# Patient Record
Sex: Female | Born: 1995 | Race: Black or African American | Hispanic: No | Marital: Married | Smoking: Current every day smoker
Health system: Southern US, Community
[De-identification: ages and names within clinical notes are randomized; demographics above are authoritative.]

---
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
## 2018-06-27 ENCOUNTER — Emergency Department (HOSPITAL_BASED_OUTPATIENT_CLINIC_OR_DEPARTMENT_OTHER): Payer: Medicaid Other

## 2018-06-27 ENCOUNTER — Encounter (HOSPITAL_BASED_OUTPATIENT_CLINIC_OR_DEPARTMENT_OTHER): Payer: Self-pay | Admitting: *Deleted

## 2018-06-27 ENCOUNTER — Emergency Department (HOSPITAL_BASED_OUTPATIENT_CLINIC_OR_DEPARTMENT_OTHER)
Admission: EM | Admit: 2018-06-27 | Discharge: 2018-06-27 | Disposition: A | Discharge status: Home / Self Care | Payer: Medicaid Other | Attending: Emergency Medicine | Admitting: Emergency Medicine

## 2018-06-27 ENCOUNTER — Other Ambulatory Visit: Payer: Self-pay

## 2018-06-27 DIAGNOSIS — R05 Cough: Secondary | ICD-10-CM | POA: Diagnosis present

## 2018-06-27 DIAGNOSIS — J069 Acute upper respiratory infection, unspecified: Secondary | ICD-10-CM | POA: Diagnosis not present

## 2018-06-27 DIAGNOSIS — F17200 Nicotine dependence, unspecified, uncomplicated: Secondary | ICD-10-CM | POA: Diagnosis not present

## 2018-06-27 DIAGNOSIS — B9789 Other viral agents as the cause of diseases classified elsewhere: Secondary | ICD-10-CM | POA: Insufficient documentation

## 2018-06-27 NOTE — ED Notes (Signed)
Pt taken to xray 

## 2018-06-27 NOTE — ED Notes (Signed)
ED Provider at bedside. 

## 2018-06-27 NOTE — ED Triage Notes (Signed)
Cough x 4 days. No fever.

## 2018-06-27 NOTE — ED Provider Notes (Signed)
[facility] EMERGENCY DEPARTMENT Provider Note   CSN: [id number] Arrival date & time: 06/27/18  2000    History   Chief Complaint Chief Complaint  Patient presents with  . Cough    HPI Dana Stone is a 23 y.o. female.     Patient is a 23 year old female who presents with coughing.  She has a 3 to 4-day history of coughing which is mostly nonproductive but at times she coughs up some mucus.  She denies any fevers.  No shortness of breath.  No vomiting or diarrhea.  No significant URI symptoms.  No leg pain or swelling.     History reviewed. No pertinent past medical history.  There are no active problems to display for this patient.   History reviewed. No pertinent surgical history.   OB History   No obstetric history on file.      Home Medications    Prior to Admission medications   Not on File    Family History No family history on file.  Social History Social History   Tobacco Use  . Smoking status: Current Every Day Smoker  . Smokeless tobacco: Never Used  Substance Use Topics  . Alcohol use: Yes  . Drug use: Never     Allergies   Patient has no known allergies.   Review of Systems Review of Systems  Constitutional: Negative for chills, diaphoresis, fatigue and fever.  HENT: Negative for congestion, rhinorrhea and sneezing.   Eyes: Negative.   Respiratory: Positive for cough. Negative for chest tightness and shortness of breath.   Cardiovascular: Negative for chest pain and leg swelling.  Gastrointestinal: Negative for abdominal pain, blood in stool, diarrhea, nausea and vomiting.  Genitourinary: Negative for difficulty urinating, flank pain, frequency and hematuria.  Musculoskeletal: Negative for arthralgias and back pain.  Skin: Negative for rash.  Neurological: Negative for dizziness, speech difficulty, weakness, numbness and headaches.     Physical Exam Updated Vital Signs BP (!) 152/84 (BP Location: Right Arm)    Pulse 92   Temp 98.5 F (36.9 C) (Oral)   Resp 20   Ht 5\' 6"  (1.676 m)   Wt 117.3 kg   LMP 06/13/2018   SpO2 100%   BMI 41.72 kg/m   Physical Exam Constitutional:      Appearance: She is well-developed.  HENT:     Head: Normocephalic and atraumatic.  Eyes:     Pupils: Pupils are equal, round, and reactive to light.  Neck:     Musculoskeletal: Normal range of motion and neck supple.  Cardiovascular:     Rate and Rhythm: Normal rate and regular rhythm.     Heart sounds: Normal heart sounds.  Pulmonary:     Effort: Pulmonary effort is normal. No respiratory distress.     Breath sounds: Normal breath sounds. No wheezing or rales.  Chest:     Chest wall: No tenderness.  Abdominal:     General: Bowel sounds are normal.     Palpations: Abdomen is soft.     Tenderness: There is no abdominal tenderness. There is no guarding or rebound.  Musculoskeletal: Normal range of motion.  Lymphadenopathy:     Cervical: No cervical adenopathy.  Skin:    General: Skin is warm and dry.     Findings: No rash.  Neurological:     Mental Status: She is alert and oriented to person, place, and time.      ED Treatments / Results  Labs (all labs ordered are  listed, but only abnormal results are displayed) Labs Reviewed - No data to display  EKG None  Radiology Dg Chest 2 View  Result Date: 06/27/2018 CLINICAL DATA:  Cough EXAM: CHEST - 2 VIEW COMPARISON:  September 09, 2012 FINDINGS: Lungs are clear. Heart is upper normal in size with pulmonary vascularity normal. No adenopathy. No bone lesions. IMPRESSION: No edema or consolidation. Electronically Signed   By: Bretta Bang III M.D.   On: 06/27/2018 21:04    Procedures Procedures (including critical care time)  Medications Ordered in ED Medications - No data to display   Initial Impression / Assessment and Plan / ED Course  I have reviewed the triage vital signs and the nursing notes.  Pertinent labs & imaging results that were  available during my care of the patient were reviewed by me and considered in my medical decision making (see chart for details).        Patient is a 23 year old female who presents with cough.  Chest x-ray shows no evidence of pneumonia.  She has no hypoxia.  She is otherwise well-appearing.  She was discharged home in good condition.  Symptomatic care instructions were given.  Her blood pressure is elevated and she was advised to follow-up with the PCP regarding this.  Final Clinical Impressions(s) / ED Diagnoses   Final diagnoses:  Viral URI with cough    ED Discharge Orders    None       Rolan Bucco, MD 06/27/18 2109

## 2019-10-02 IMAGING — DX CHEST - 2 VIEW
2 series · 2 of 2 positions shown · non-contrast
Comparison: September 09, 2012

CLINICAL DATA: Cough

EXAM:
CHEST - 2 VIEW

[chest pa]
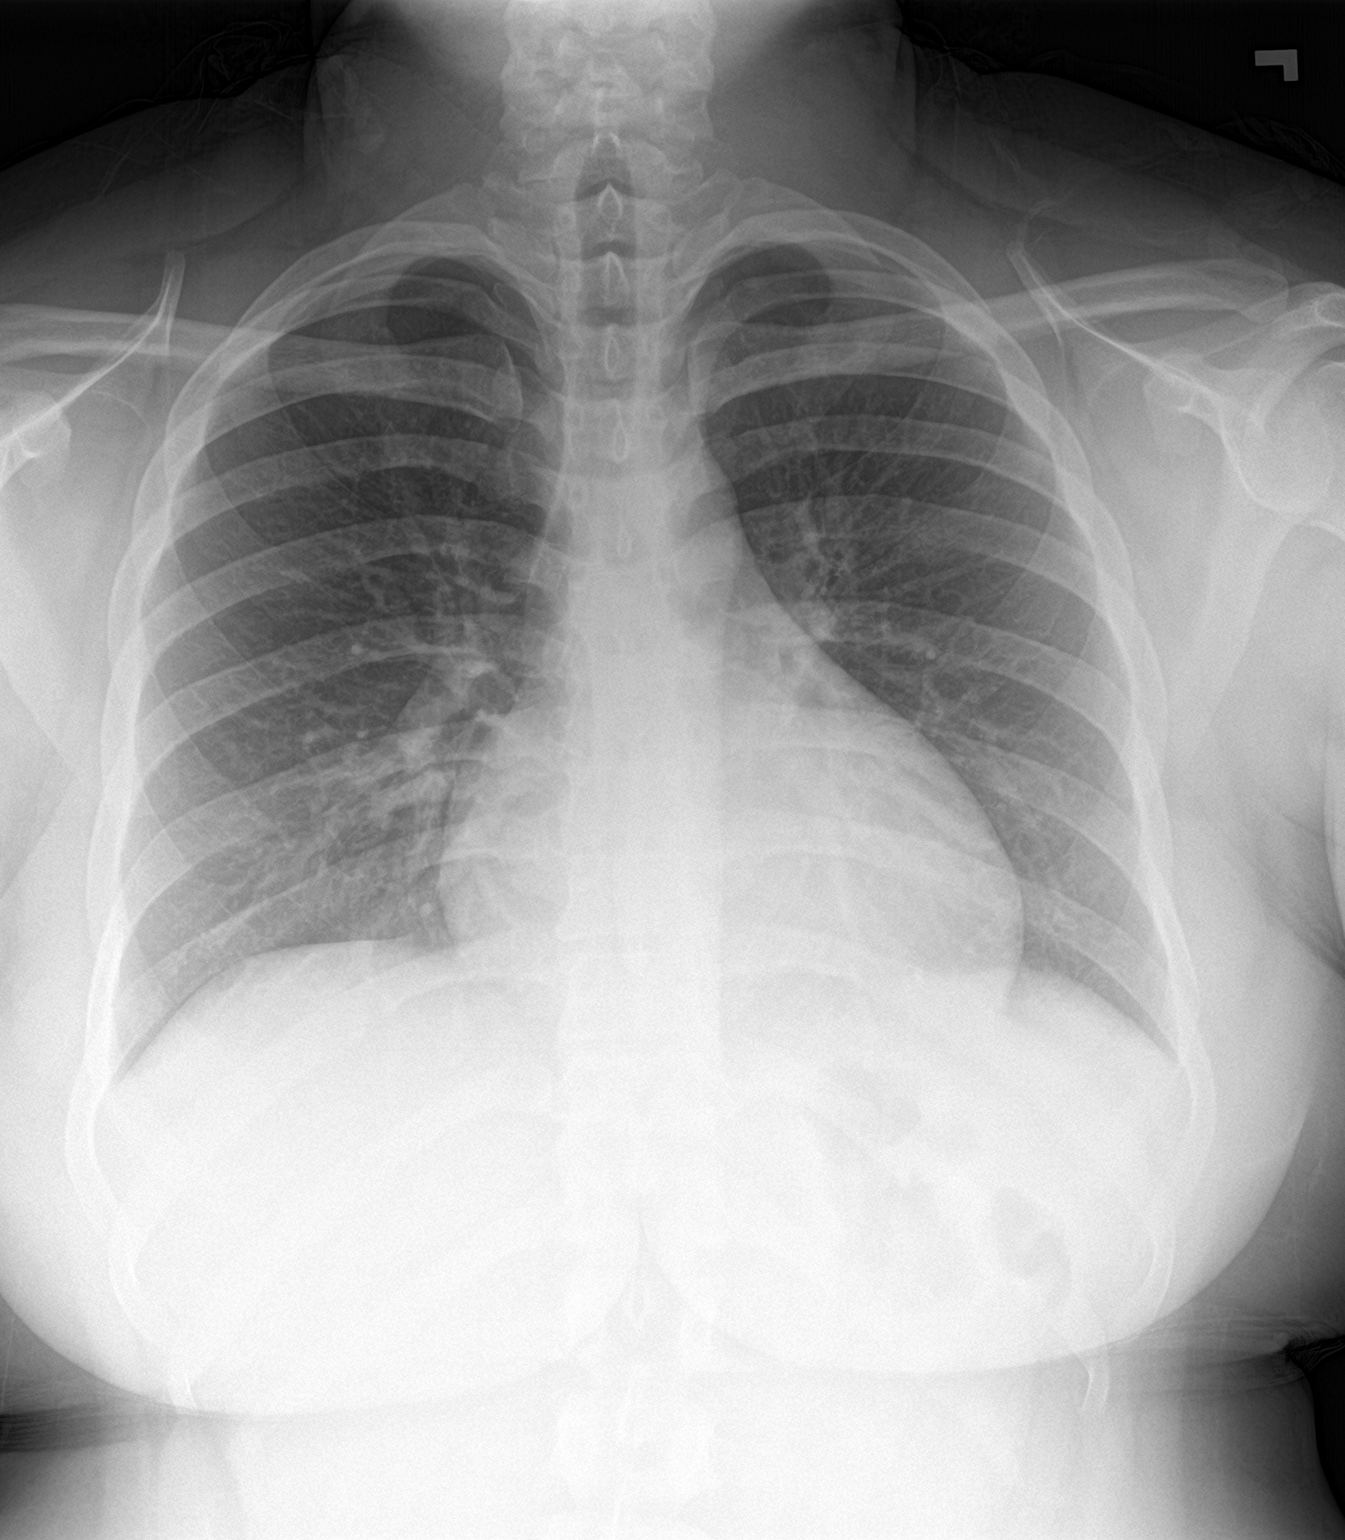

[chest lat]
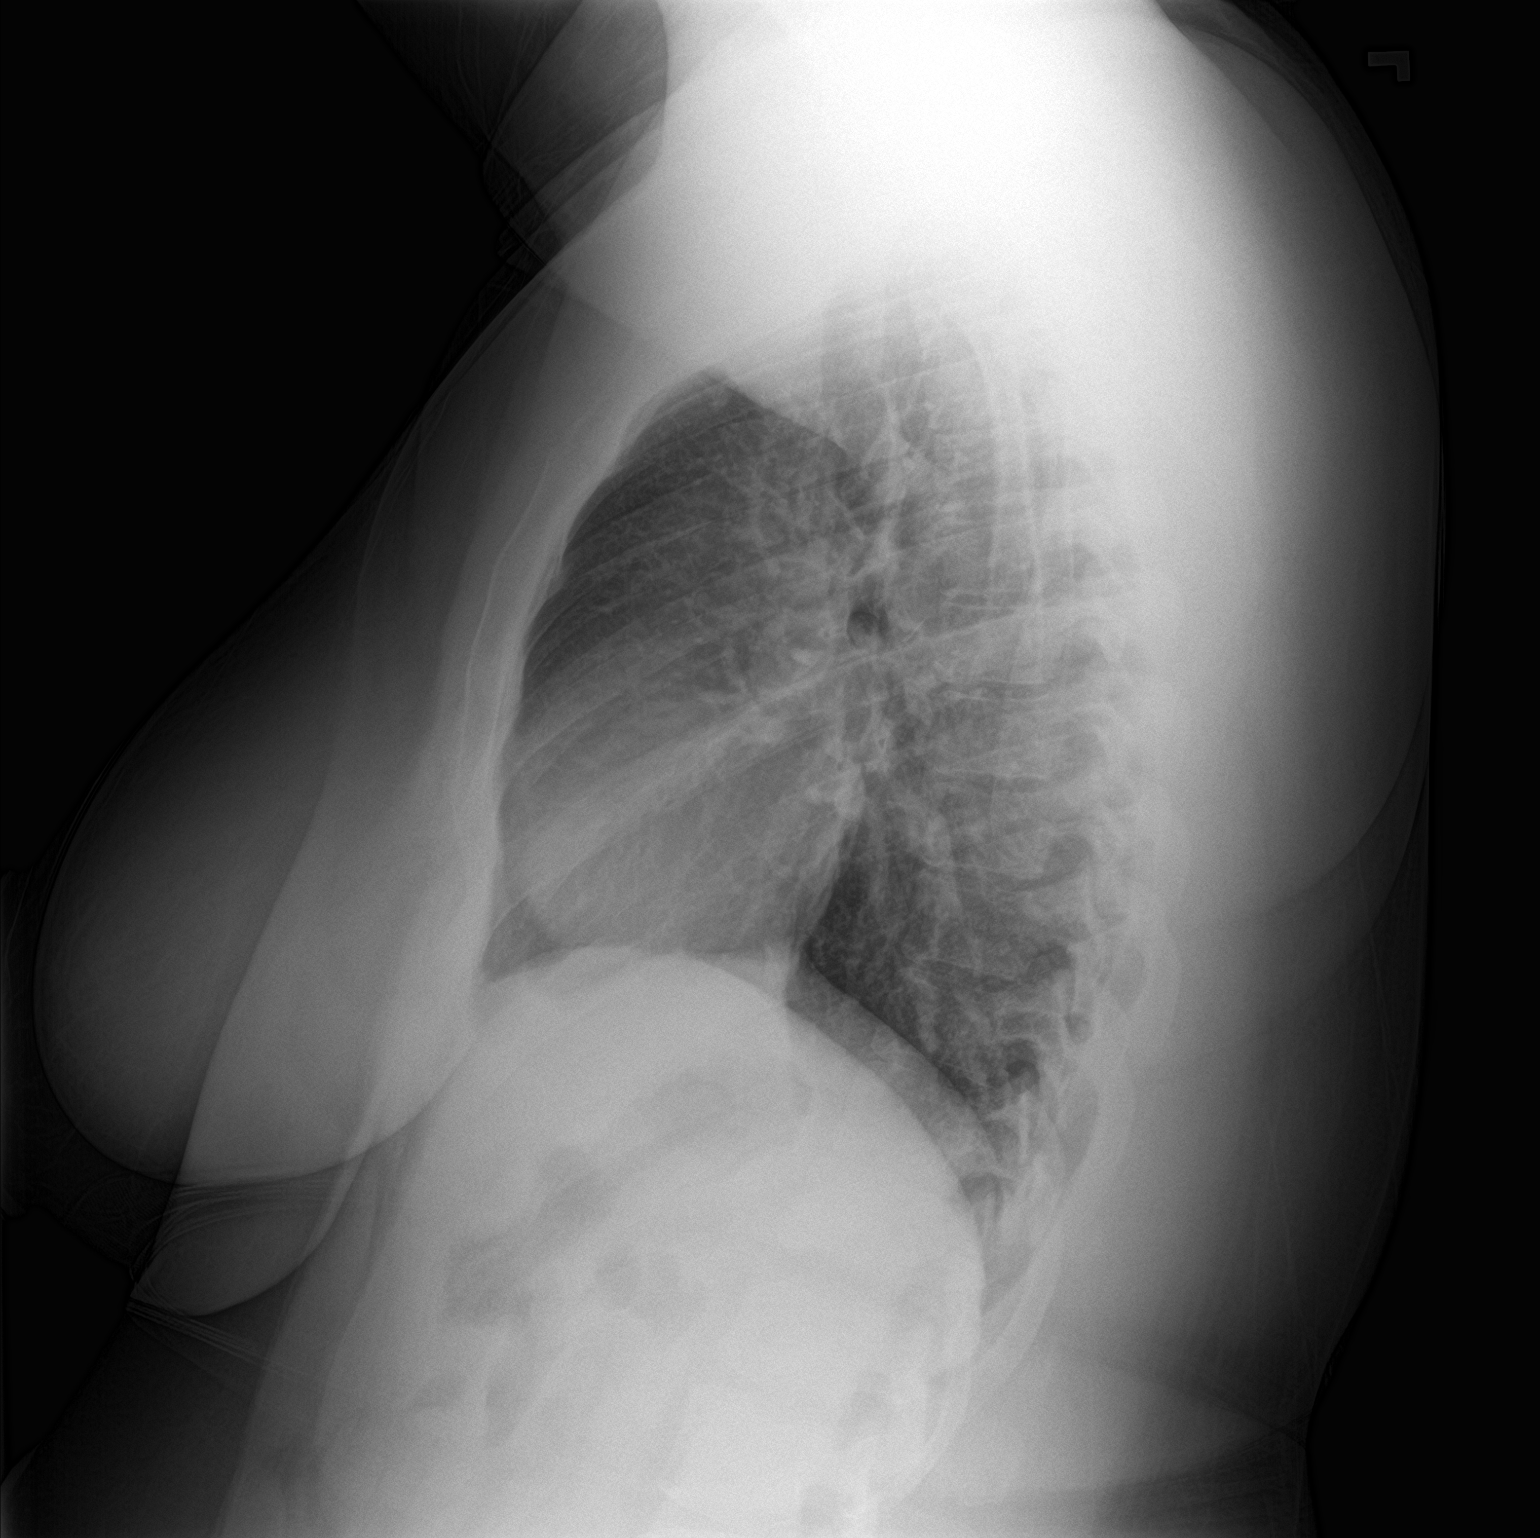

[2 of 2 positions shown; findings below may reference images not displayed]

FINDINGS: Lungs are clear. Heart is upper normal in size with pulmonary
vascularity normal. No adenopathy. No bone lesions.
IMPRESSION: No edema or consolidation.
